# Patient Record
Sex: Male | Born: 1995 | Race: White | Hispanic: No | Marital: Married
Health system: Southern US, Community
[De-identification: ages and names within clinical notes are randomized; demographics above are authoritative.]

---
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
## 2019-04-09 ENCOUNTER — Ambulatory Visit: Payer: Self-pay | Attending: Internal Medicine

## 2019-04-09 DIAGNOSIS — Z23 Encounter for immunization: Secondary | ICD-10-CM

## 2019-04-09 NOTE — Progress Notes (Signed)
   [facility]ov[facility] Vaccination [facility]  Name:  Douglas Galloway    MRN: [id number] DOB: [birth year]  04/09/2019  [name] was observed post Covid-19 immunization for 15 minutes without incident. He was provided with Vaccine Information Sheet and instruction to access the V-Safe system.   [name] was instructed to call 911 with any severe reactions post vaccine: Marland Kitchen Difficulty breathing  . Swelling of face and throat  . A fast heartbeat  . A bad rash all over body  . Dizziness and weakness   Immunizations Administered    Name Date Dose VIS Date Route   Pfizer COVID-19 Vaccine 04/09/2019  1:47 PM 0.3 mL 12/23/2018 Intramuscular   Manufacturer: [employer], [employer]   Lot: [id number]   NDC: [address]-9

## 2019-05-03 ENCOUNTER — Ambulatory Visit: Payer: Self-pay | Attending: Internal Medicine

## 2019-05-03 DIAGNOSIS — Z23 Encounter for immunization: Secondary | ICD-10-CM

## 2019-05-03 NOTE — Progress Notes (Signed)
   Covid-19 Vaccination Clinic  Name:  Douglas Galloway    MRN: [id number] DOB: 12-Mar-1995  05/03/2019  [name] was observed post Covid-19 immunization for 15 minutes without incident. He was provided with Vaccine Information Sheet and instruction to access the V-Safe system.   [name] was instructed to call 911 with any severe reactions post vaccine: Marland Kitchen Difficulty breathing  . Swelling of face and throat  . A fast heartbeat  . A bad rash all over body  . Dizziness and weakness   Immunizations Administered    Name Date Dose VIS Date Route   Pfizer COVID-19 Vaccine 05/03/2019 10:53 AM 0.3 mL 03/08/2018 Intramuscular   Manufacturer: [employer], [employer]   Lot: [id number]   NDC: [address]-6
